# Patient Record
Sex: Male | Born: 1990 | Race: Black or African American | Hispanic: No | Marital: Single | Smoking: Current some day smoker
Health system: Southern US, Community
[De-identification: ages and names within clinical notes are randomized; demographics above are authoritative.]

## PROBLEM LIST (undated history)

## (undated) HISTORY — PX: NO PAST SURGERIES: SHX2092

---
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
## 2012-04-01 ENCOUNTER — Emergency Department: Payer: Self-pay | Admitting: Unknown Physician Specialty

## 2012-04-01 LAB — CBC
HCT: 42.4 % (ref 40.0–52.0)
HGB: 15.1 g/dL (ref 13.0–18.0)
MCH: 30.9 pg (ref 26.0–34.0)
MCV: 87 fL (ref 80–100)
RDW: 13.6 % (ref 11.5–14.5)
WBC: 8.4 10*3/uL (ref 3.8–10.6)

## 2012-04-01 LAB — COMPREHENSIVE METABOLIC PANEL
Albumin: 4.1 g/dL (ref 3.4–5.0)
Alkaline Phosphatase: 61 U/L (ref 50–136)
Anion Gap: 5 — ABNORMAL LOW (ref 7–16)
Bilirubin,Total: 0.6 mg/dL (ref 0.2–1.0)
Creatinine: 0.88 mg/dL (ref 0.60–1.30)
Glucose: 81 mg/dL (ref 65–99)
Osmolality: 287 (ref 275–301)
Potassium: 4 mmol/L (ref 3.5–5.1)
Sodium: 145 mmol/L (ref 136–145)
Total Protein: 7.3 g/dL (ref 6.4–8.2)

## 2012-04-01 LAB — URINALYSIS, COMPLETE
Bacteria: NONE SEEN
Blood: NEGATIVE
Glucose,UR: NEGATIVE mg/dL (ref 0–75)
Ketone: NEGATIVE
Leukocyte Esterase: NEGATIVE
Nitrite: NEGATIVE
Ph: 5 (ref 4.5–8.0)
Protein: NEGATIVE
Specific Gravity: 1.031 (ref 1.003–1.030)
WBC UR: 1 /HPF (ref 0–5)

## 2013-10-24 IMAGING — CT CT HEAD WITHOUT CONTRAST
2 series · 16 of 30 positions shown, 20 images · non-contrast
Comparison: none

REASON FOR EXAM: sudden onset headache wtih blurred vision     13
COMMENTS:   LMP: (Male)

PROCEDURE:     CT  - CT HEAD WITHOUT CONTRAST  - April 01, 2012  [DATE]
RESULT:     History: Blurred vision.
Comparison Study: No prior.

[Series 2: without · axial · non-contrast · 0.48mm/px · z∈[-177,-57]mm · 13 of 30 slices shown, 17 images]
[im 3/30  brain]
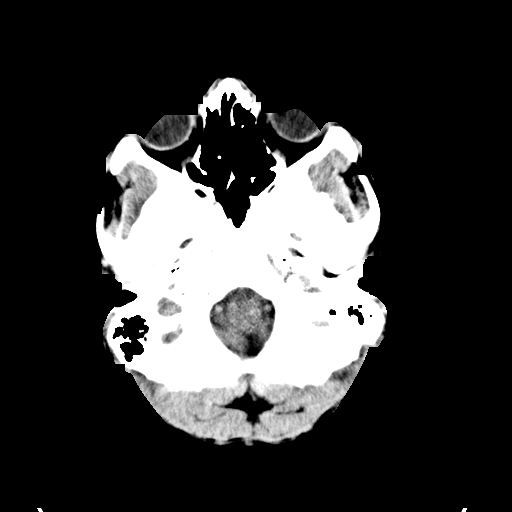
[im 3/30  bone]
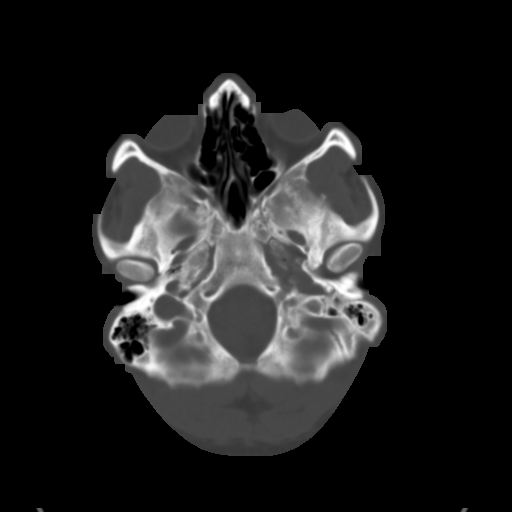
[im 5/30  brain]
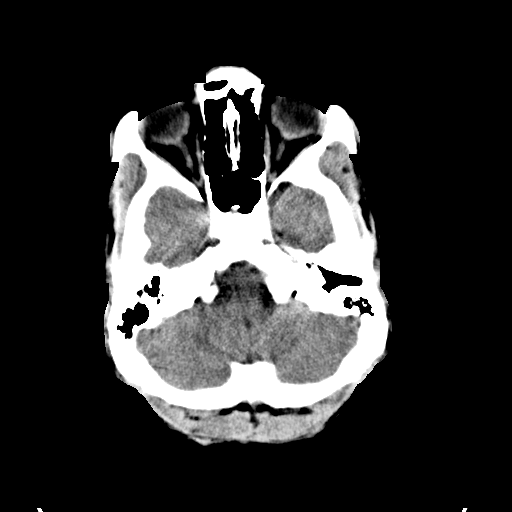
[im 7/30  brain]
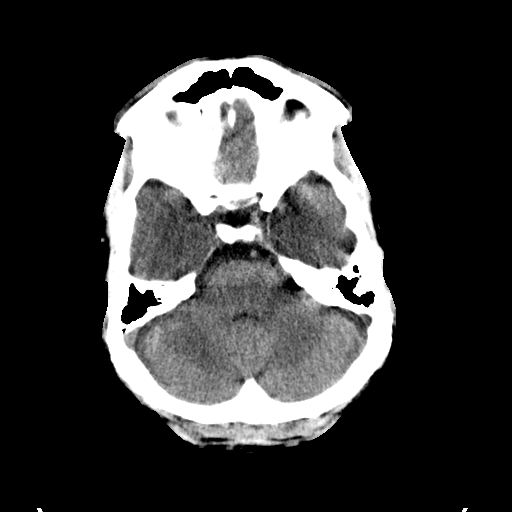
[im 9/30  brain]
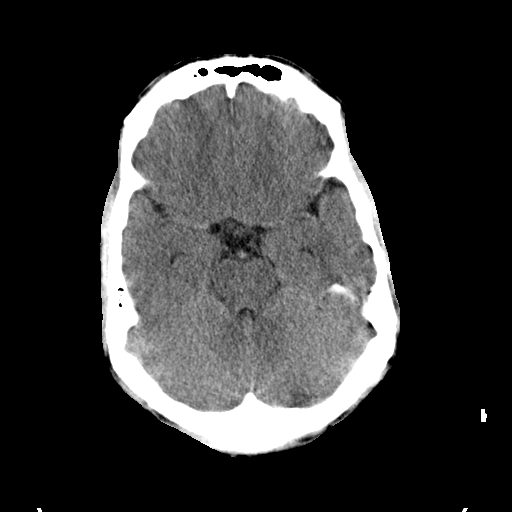
[im 11/30  brain]
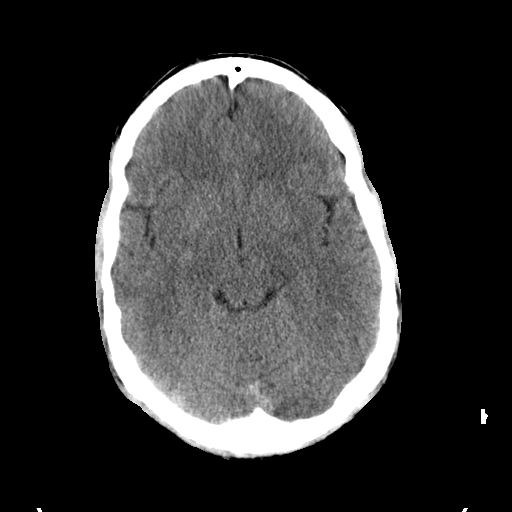
[im 11/30  bone]
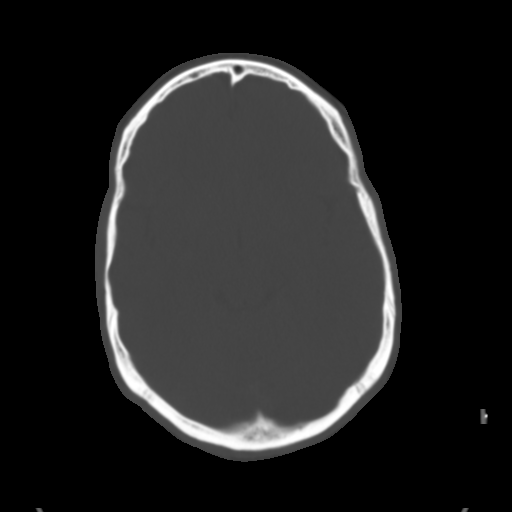
[im 13/30  brain]
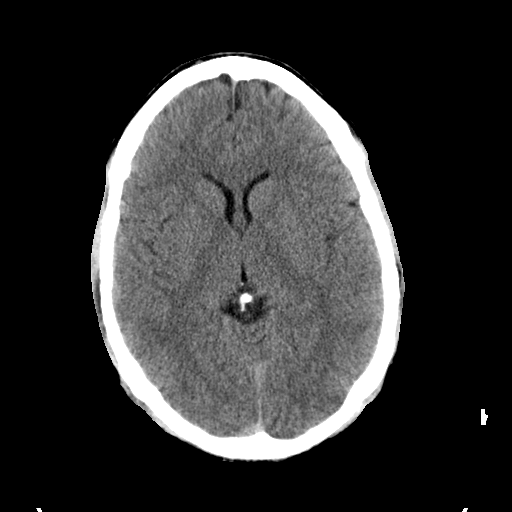
[im 15/30  brain]
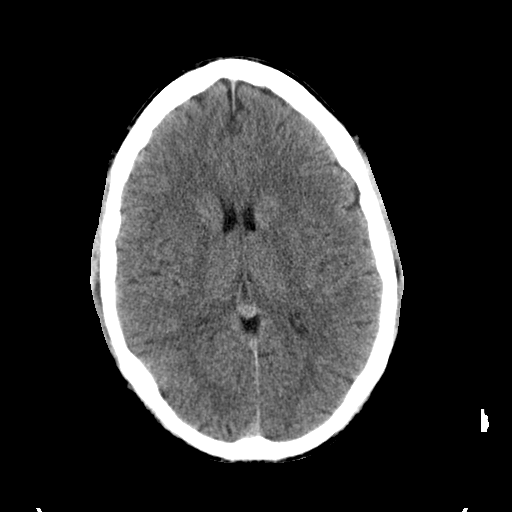
[im 17/30  brain]
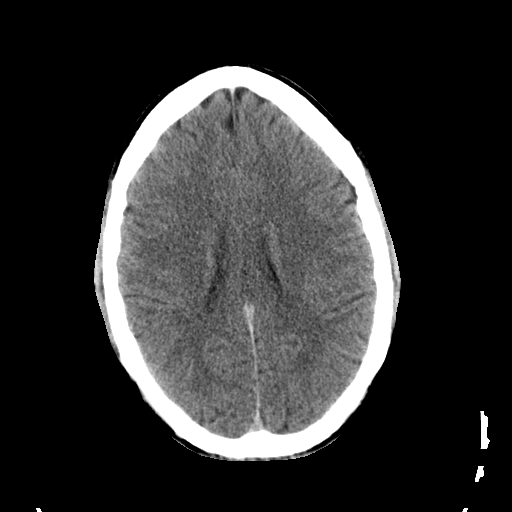
[im 19/30  brain]
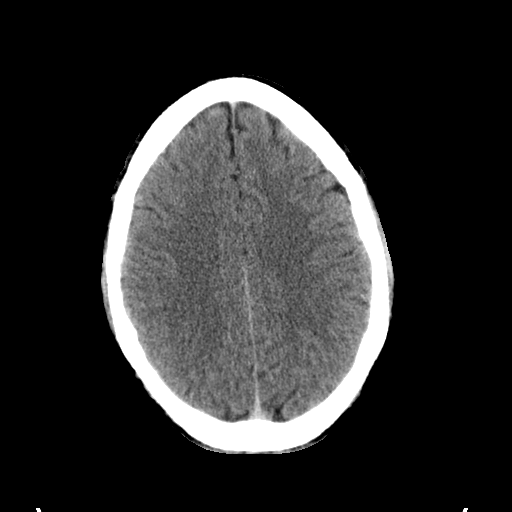
[im 19/30  bone]
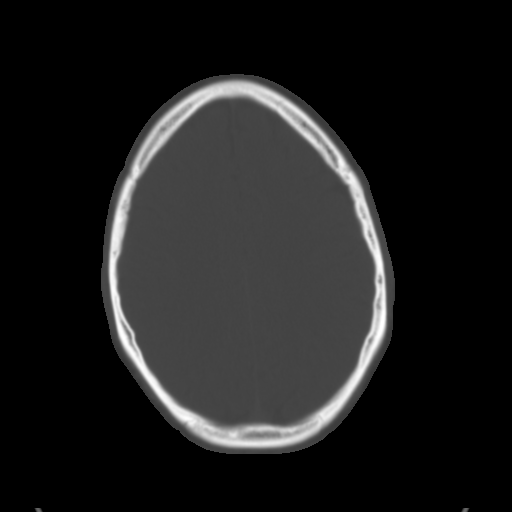
[im 21/30  brain]
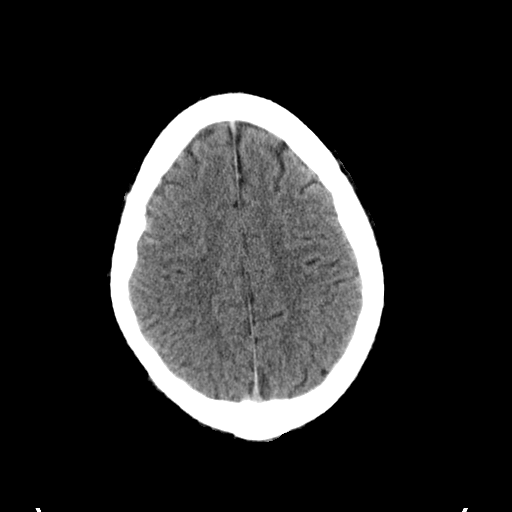
[im 23/30  brain]
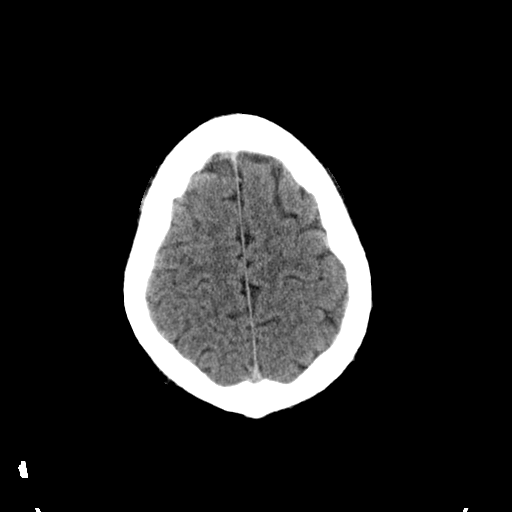
[im 25/30  brain]
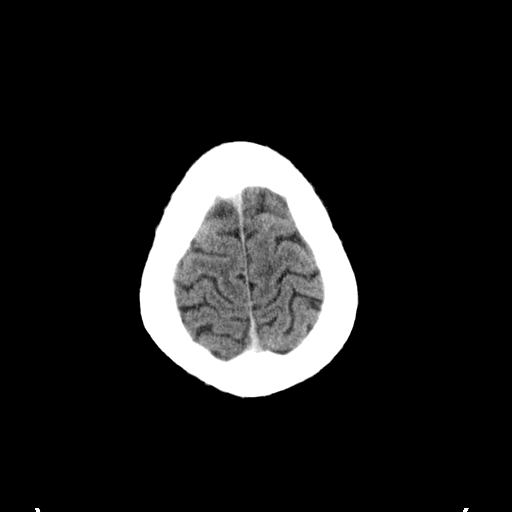
[im 27/30  brain]
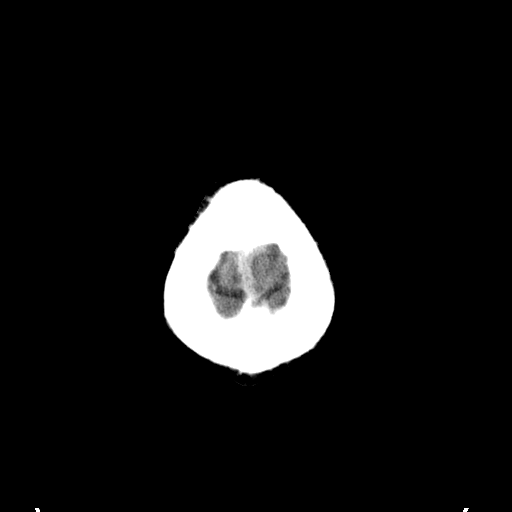
[im 27/30  bone]
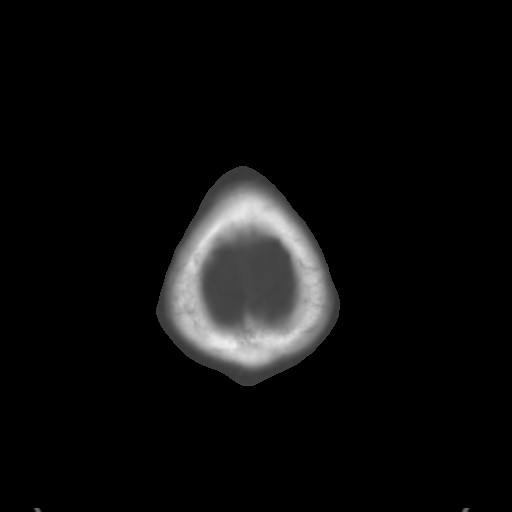

[Series 3: bone · axial · 0.48mm/px · z∈[-177,-137]mm · 3 of 30 slices shown]
[im 3/30  bone]
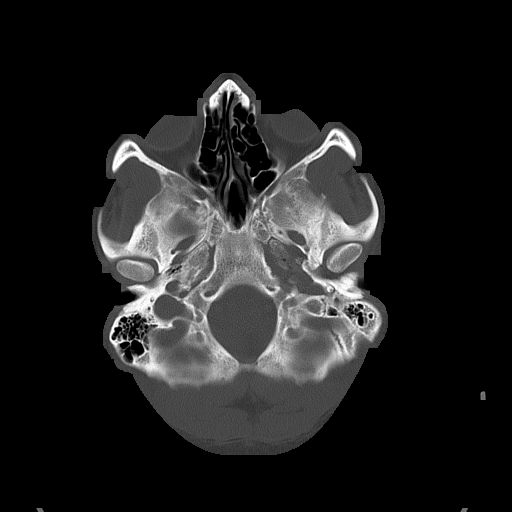
[im 7/30  bone]
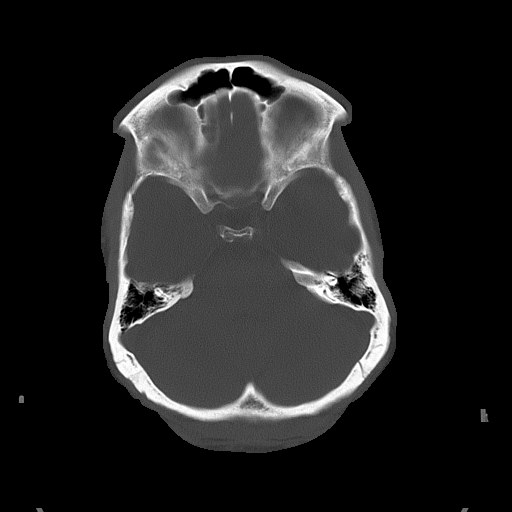
[im 11/30  bone]
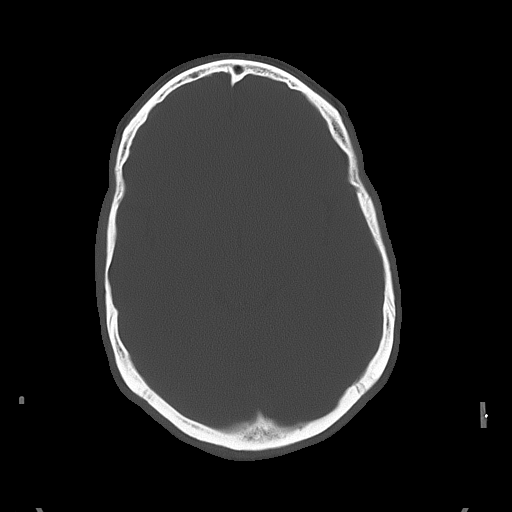

[16 of 30 positions shown; findings below may reference images not displayed]

FINDINGS: Standard nonenhanced CT obtained. No mass. No hydrocephalus. No
hemorrhage. No acute bony abnormality.
IMPRESSION: No acute abnormality.

## 2014-05-05 ENCOUNTER — Ambulatory Visit: Payer: Self-pay | Admitting: Emergency Medicine

## 2016-01-25 ENCOUNTER — Emergency Department
Admission: EM | Admit: 2016-01-25 | Discharge: 2016-01-25 | Disposition: A | Discharge status: Home / Self Care | Payer: Self-pay | Attending: Emergency Medicine | Admitting: Emergency Medicine

## 2016-01-25 DIAGNOSIS — Y990 Civilian activity done for income or pay: Secondary | ICD-10-CM | POA: Insufficient documentation

## 2016-01-25 DIAGNOSIS — M549 Dorsalgia, unspecified: Secondary | ICD-10-CM | POA: Insufficient documentation

## 2016-01-25 DIAGNOSIS — M62838 Other muscle spasm: Secondary | ICD-10-CM

## 2016-01-25 DIAGNOSIS — G44319 Acute post-traumatic headache, not intractable: Secondary | ICD-10-CM

## 2016-01-25 DIAGNOSIS — Y92410 Unspecified street and highway as the place of occurrence of the external cause: Secondary | ICD-10-CM | POA: Insufficient documentation

## 2016-01-25 DIAGNOSIS — Y9389 Activity, other specified: Secondary | ICD-10-CM | POA: Insufficient documentation

## 2016-01-25 DIAGNOSIS — M542 Cervicalgia: Secondary | ICD-10-CM | POA: Insufficient documentation

## 2016-01-25 MED ORDER — MELOXICAM 15 MG PO TABS: 15.0000 mg | ORAL_TABLET | Freq: Every day | ORAL | 0 refills | Status: DC

## 2016-01-25 MED ORDER — METHOCARBAMOL 500 MG PO TABS: 500.0000 mg | ORAL_TABLET | Freq: Four times a day (QID) | ORAL | 0 refills | Status: DC
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
## 2017-08-29 ENCOUNTER — Emergency Department
Admission: EM | Admit: 2017-08-29 | Discharge: 2017-08-29 | Disposition: A | Discharge status: Home / Self Care | Payer: Self-pay | Attending: Emergency Medicine | Admitting: Emergency Medicine

## 2017-08-29 ENCOUNTER — Encounter: Payer: Self-pay | Admitting: Emergency Medicine

## 2017-08-29 DIAGNOSIS — Z87891 Personal history of nicotine dependence: Secondary | ICD-10-CM | POA: Insufficient documentation

## 2017-08-29 DIAGNOSIS — J0110 Acute frontal sinusitis, unspecified: Secondary | ICD-10-CM | POA: Insufficient documentation

## 2017-08-29 DIAGNOSIS — K920 Hematemesis: Secondary | ICD-10-CM | POA: Insufficient documentation

## 2017-08-29 LAB — CBC WITH DIFFERENTIAL/PLATELET
BASOS ABS: 0.1 10*3/uL (ref 0–0.1)
Basophils Relative: 1 %
EOS PCT: 2 %
Eosinophils Absolute: 0.1 10*3/uL (ref 0–0.7)
HEMATOCRIT: 40.1 % (ref 40.0–52.0)
Hemoglobin: 13.8 g/dL (ref 13.0–18.0)
LYMPHS ABS: 2.5 10*3/uL (ref 1.0–3.6)
Lymphocytes Relative: 32 %
MCH: 29.8 pg (ref 26.0–34.0)
MCHC: 34.5 g/dL (ref 32.0–36.0)
MCV: 86.3 fL (ref 80.0–100.0)
MONO ABS: 0.6 10*3/uL (ref 0.2–1.0)
MONOS PCT: 8 %
NEUTROS ABS: 4.6 10*3/uL (ref 1.4–6.5)
Neutrophils Relative %: 57 %
PLATELETS: 179 10*3/uL (ref 150–440)
RBC: 4.64 MIL/uL (ref 4.40–5.90)
RDW: 14.3 % (ref 11.5–14.5)
WBC: 7.9 10*3/uL (ref 3.8–10.6)

## 2017-08-29 LAB — COMPREHENSIVE METABOLIC PANEL
ALT: 27 U/L (ref 17–63)
AST: 26 U/L (ref 15–41)
Albumin: 4.1 g/dL (ref 3.5–5.0)
Alkaline Phosphatase: 51 U/L (ref 38–126)
Anion gap: 9 (ref 5–15)
BILIRUBIN TOTAL: 0.5 mg/dL (ref 0.3–1.2)
BUN: 14 mg/dL (ref 6–20)
CHLORIDE: 108 mmol/L (ref 101–111)
CO2: 24 mmol/L (ref 22–32)
CREATININE: 0.8 mg/dL (ref 0.61–1.24)
Calcium: 8.6 mg/dL — ABNORMAL LOW (ref 8.9–10.3)
Glucose, Bld: 92 mg/dL (ref 65–99)
POTASSIUM: 3.6 mmol/L (ref 3.5–5.1)
Sodium: 141 mmol/L (ref 135–145)
TOTAL PROTEIN: 7.4 g/dL (ref 6.5–8.1)

## 2017-08-29 LAB — LIPASE, BLOOD: LIPASE: 20 U/L (ref 11–51)

## 2017-08-29 LAB — ETHANOL: ALCOHOL ETHYL (B): 22 mg/dL — AB (ref <10)

## 2017-08-29 MED ORDER — OXYMETAZOLINE HCL 0.05 % NA SOLN: 1.0000 | Freq: Two times a day (BID) | NASAL | 0 refills | Status: DC

## 2017-08-29 MED ORDER — ONDANSETRON 4 MG PO TBDP: 4.0000 mg | ORAL_TABLET | Freq: Three times a day (TID) | ORAL | 0 refills | Status: DC | PRN

## 2017-08-29 MED ORDER — FAMOTIDINE 20 MG PO TABS: 20.0000 mg | ORAL_TABLET | Freq: Two times a day (BID) | ORAL | 1 refills | Status: DC

## 2017-08-29 MED ORDER — ONDANSETRON HCL 4 MG/2ML IJ SOLN
4.0000 mg | Freq: Once | INTRAMUSCULAR | Status: AC
Start: 2017-08-29 — End: 2017-08-29
  Administered 2017-08-29: 4 mg via INTRAVENOUS
  Filled 2017-08-29: qty 2

## 2017-08-29 MED ORDER — FAMOTIDINE IN NACL 20-0.9 MG/50ML-% IV SOLN
20.0000 mg | Freq: Once | INTRAVENOUS | Status: AC
Start: 2017-08-29 — End: 2017-08-29
  Administered 2017-08-29: 20 mg via INTRAVENOUS
  Filled 2017-08-29: qty 50

## 2017-08-29 NOTE — ED Triage Notes (Signed)
Patient with complaint of headache times two days. Patient reports vomiting bright red blood times two this morning.

## 2017-08-29 NOTE — ED Provider Notes (Signed)
[facility] Emergency Department Provider Note       Time seen: ----------------------------------------- 7:00 AM on 08/29/2017 -----------------------------------------   I have reviewed the triage vital signs and the nursing notes.  HISTORY   Chief Complaint Hematemesis and Headache    HPI Kurt Ruiz is a 27 y.o. male with no significant past medical history who presents to the ED for complaint of headache which is mostly frontal for the past 2 days.  Patient states his sinuses have been bothering him for about 3 days.  Patient reports this morning he started having some vomiting he saw some bright red blood in his vomit twice this morning.  He also has had some diarrhea.  He denies any fever.  He denies significant alcohol intake or NSAID use.  History reviewed. No pertinent past medical history.  There are no active problems to display for this patient.   History reviewed. No pertinent surgical history.  Allergies Patient has no known allergies.  Social History Social History   Tobacco Use  . Smoking status: Former Games developer  . Smokeless tobacco: Never Used  Substance Use Topics  . Alcohol use: Yes  . Drug use: Never    Review of Systems Constitutional: Negative for fever. ENT: Positive for sinus congestion Cardiovascular: Negative for chest pain. Respiratory: Negative for shortness of breath. Gastrointestinal: Negative for abdominal pain, positive for hematemesis, diarrhea Genitourinary: Negative for dysuria. Musculoskeletal: Negative for back pain. Skin: Negative for rash. Neurological: Positive for headache  All systems negative/normal/unremarkable except as stated in the HPI  ____________________________________________   PHYSICAL EXAM:  VITAL SIGNS: ED Triage Vitals [08/29/17 0657]  Enc Vitals Group     BP 129/67     Pulse Rate 72     Resp 18     Temp 98.2 F (36.8 C)     Temp Source Oral     SpO2 94 %     Weight  229 lb (103.9 kg)     Height 5\' 7"  (1.702 m)     Head Circumference      Peak Flow      Pain Score 7     Pain Loc      Pain Edu?      Excl. in GC?    Constitutional: Alert and oriented. Well appearing and in no distress. Eyes: Conjunctivae are normal. Normal extraocular movements. ENT   Head: Normocephalic and atraumatic.   Nose: No congestion/rhinnorhea.   Mouth/Throat: Mucous membranes are moist.   Neck: No stridor. Cardiovascular: Normal rate, regular rhythm. No murmurs, rubs, or gallops. Respiratory: Normal respiratory effort without tachypnea nor retractions. Breath sounds are clear and equal bilaterally. No wheezes/rales/rhonchi. Gastrointestinal: Soft and nontender. Normal bowel sounds Musculoskeletal: Nontender with normal range of motion in extremities. No lower extremity tenderness nor edema. Neurologic:  Normal speech and language. No gross focal neurologic deficits are appreciated.  Skin:  Skin is warm, dry and intact. No rash noted. Psychiatric: Mood and affect are normal. Speech and behavior are normal.  ____________________________________________  ED COURSE:  As part of my medical decision making, I reviewed the following data within the electronic MEDICAL RECORD NUMBER History obtained from family if available, nursing notes, old chart and ekg, as well as notes from prior ED visits. Patient presented for headache and vomiting, we will assess with labs as indicated at this time.   Procedures ____________________________________________   LABS (pertinent positives/negatives)  Labs Reviewed  COMPREHENSIVE METABOLIC PANEL - Abnormal; Notable for the following components:  Result Value   Calcium 8.6 (*)    All other components within normal limits  ETHANOL - Abnormal; Notable for the following components:   Alcohol, Ethyl (B) 22 (*)    All other components within normal limits  CBC WITH DIFFERENTIAL/PLATELET  LIPASE, BLOOD   ___________________________________________  DIFFERENTIAL DIAGNOSIS   Gastritis, gastroenteritis, Mallory-Weiss tear, sinusitis  FINAL ASSESSMENT AND PLAN  Sinusitis, vomiting   Plan: The patient had presented for headache and vomiting with potentially some blood in his vomit. Patient's labs here are reassuring.  I will prescribe some Afrin nasal spray as he has some sinusitis at the moment.  He likely has gastritis and was given Zofran and Pepcid here.  He will be discharged with similar and is cleared for outpatient GI follow-up.   Ulice DashJohnathan E Williams, MD   Note: This note was generated in part or whole with voice recognition software. Voice recognition is usually quite accurate but there are transcription errors that can and very often do occur. I apologize for any typographical errors that were not detected and corrected.     Emily FilbertWilliams, Jonathan E, MD 08/29/17 [id number]

## 2018-02-11 ENCOUNTER — Ambulatory Visit
Admission: EM | Admit: 2018-02-11 | Discharge: 2018-02-11 | Disposition: A | Discharge status: Home / Self Care | Attending: Family Medicine | Admitting: Family Medicine

## 2018-02-11 ENCOUNTER — Encounter: Payer: Self-pay | Admitting: Emergency Medicine

## 2018-02-11 ENCOUNTER — Ambulatory Visit (INDEPENDENT_AMBULATORY_CARE_PROVIDER_SITE_OTHER)

## 2018-02-11 ENCOUNTER — Other Ambulatory Visit: Payer: Self-pay

## 2018-02-11 DIAGNOSIS — M25561 Pain in right knee: Secondary | ICD-10-CM

## 2018-02-11 DIAGNOSIS — S93401A Sprain of unspecified ligament of right ankle, initial encounter: Secondary | ICD-10-CM

## 2018-02-11 DIAGNOSIS — W1842XA Slipping, tripping and stumbling without falling due to stepping into hole or opening, initial encounter: Secondary | ICD-10-CM

## 2018-02-11 MED ORDER — DICLOFENAC SODIUM 75 MG PO TBEC: 75.0000 mg | DELAYED_RELEASE_TABLET | Freq: Two times a day (BID) | ORAL | 0 refills | Status: DC | PRN

## 2018-02-11 NOTE — ED Triage Notes (Signed)
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

## 2018-02-11 NOTE — ED Provider Notes (Signed)
[facility]    CSN: [id number] Arrival date & time: 02/11/18  1432  History   Chief Complaint Chief Complaint  Patient presents with  . Ankle Injury    W/C DOI 02/11/18   HPI  27 year old male presents with right ankle pain.  Patient is a Paramedic.  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  He states that he stepped in a "hole" and twisted his right ankle.  He reports lateral ankle pain.  No swelling reported.  No swelling noted.  Patient also reports that he is having some right knee pain as well.  No medications or interventions tried.  Patient states that he wants to be out of work and wants to be on light duty.  No other reported symptoms.  No other complaints.  History reviewed and updated as below. PMH: Hx of R achilles tendon tear  Home Medications    Prior to Admission medications   Medication Sig Start Date End Date Taking? Authorizing Provider  Multiple Vitamin (ONE-A-DAY MENS PO) Take 1 tablet by mouth daily.   Yes [provider]  diclofenac (VOLTAREN) 75 MG EC tablet Take 1 tablet (75 mg total) by mouth 2 (two) times daily as needed. 02/11/18   Tommie Sams, DO    Family History Family History  Problem Relation Age of Onset  . Other Mother        unsure of medical history  . Hypertension Father     Social History Social History   Tobacco Use  . Smoking status: Former Smoker    Last attempt to quit: 08/11/2017    Years since quitting: 0.5  . Smokeless tobacco: Never Used  Substance Use Topics  . Alcohol use: Yes    Comment: occassionally  . Drug use: Never     Allergies   Patient has no known allergies.   Review of Systems Review of Systems  Musculoskeletal:       Right ankle and right knee pain.  Skin: Negative.    Physical Exam Triage Vital Signs ED Triage Vitals  Enc Vitals Group     BP 02/11/18 1507 (!) 122/91     Pulse Rate 02/11/18 1507 78     Resp 02/11/18 1507 16     Temp 02/11/18 1507 98.6 F  (37 C)     Temp Source 02/11/18 1507 Oral     SpO2 02/11/18 1507 99 %     Weight 02/11/18 1506 230 lb (104.3 kg)     Height 02/11/18 1506 5\' 7"  (1.702 m)     Head Circumference --      Peak Flow --      Pain Score 02/11/18 1506 7     Pain Loc --      Pain Edu? --      Excl. in GC? --    Updated Vital Signs BP (!) 122/91 (BP Location: Left Arm)   Pulse 78   Temp 98.6 F (37 C) (Oral)   Resp 16   Ht 5\' 7"  (1.702 m)   Wt 104.3 kg   SpO2 99%   BMI 36.02 kg/m   Visual Acuity Right Eye Distance:   Left Eye Distance:   Bilateral Distance:    Right Eye Near:   Left Eye Near:    Bilateral Near:     Physical Exam  Constitutional: He appears well-developed. No distress.  HENT:  Head: Normocephalic and atraumatic.  Cardiovascular: Normal rate and regular rhythm.  Pulmonary/Chest: Effort normal. He has no wheezes. He has no rales.  Musculoskeletal:  Right knee -no effusion.  No discrete areas of tenderness.  Ligaments intact.  Right ankle -no apparent swelling.  No erythema.  Patient with reported tenderness laterally.  Neurological: He is alert.  Skin: Skin is warm. No rash noted. No erythema.  Psychiatric: He has a normal mood and affect. His behavior is normal.  Nursing note and vitals reviewed.  UC Treatments / Results  Labs (all labs ordered are listed, but only abnormal results are displayed) Labs Reviewed - No data to display  EKG None  Radiology Dg Ankle Complete Right  Result Date: 02/11/2018 CLINICAL DATA:  Twisting injury.  Pain. EXAM: RIGHT ANKLE - COMPLETE 3+ VIEW COMPARISON:  None. FINDINGS: There is no evidence of fracture, dislocation, or joint effusion. There is no evidence of arthropathy or other focal bone abnormality. Soft tissues are unremarkable. IMPRESSION: Negative. Electronically Signed   By: Paulina FusiMark  Shogry M.D.   On: 02/11/2018 15:40    Procedures Procedures (including critical care time)  Medications Ordered in UC Medications - No data to  display  Initial Impression / Assessment and Plan / UC Course  I have reviewed the triage vital signs and the nursing notes.  Pertinent labs & imaging results that were available during my care of the patient were reviewed by me and considered in my medical decision making (see chart for details).    26-year male presents with mild ankle sprain.  Patient is ambulatory.  Exam is benign.  X-ray negative.  Advised rest, ice, elevation.  Diclofenac as directed.  Work note given.  Final Clinical Impressions(s) / UC Diagnoses   Final diagnoses:  Sprain of right ankle, unspecified ligament, initial encounter   Discharge Instructions   None    ED Prescriptions    Medication Sig Dispense Auth. Provider   diclofenac (VOLTAREN) 75 MG EC tablet Take 1 tablet (75 mg total) by mouth 2 (two) times daily as needed. 30 tablet Tommie Samsook, Flor Houdeshell G, DO     Controlled Substance Prescriptions [place] Controlled Substance Registry consulted? Not Applicable   Tommie SamsCook, Garnet Overfield G, DO 02/11/18 1601

## 2018-07-12 ENCOUNTER — Other Ambulatory Visit: Payer: Self-pay

## 2018-07-12 ENCOUNTER — Ambulatory Visit
Admission: EM | Admit: 2018-07-12 | Discharge: 2018-07-12 | Disposition: A | Discharge status: Home / Self Care | Payer: Self-pay | Attending: Family Medicine | Admitting: Family Medicine

## 2018-07-12 ENCOUNTER — Encounter: Payer: Self-pay | Admitting: Emergency Medicine

## 2018-07-12 DIAGNOSIS — R05 Cough: Secondary | ICD-10-CM

## 2018-07-12 DIAGNOSIS — R0981 Nasal congestion: Secondary | ICD-10-CM

## 2018-07-12 DIAGNOSIS — J029 Acute pharyngitis, unspecified: Secondary | ICD-10-CM

## 2018-07-12 DIAGNOSIS — J069 Acute upper respiratory infection, unspecified: Secondary | ICD-10-CM

## 2018-07-12 DIAGNOSIS — R51 Headache: Secondary | ICD-10-CM

## 2018-07-12 DIAGNOSIS — Z87891 Personal history of nicotine dependence: Secondary | ICD-10-CM

## 2018-07-12 DIAGNOSIS — B9789 Other viral agents as the cause of diseases classified elsewhere: Secondary | ICD-10-CM

## 2018-07-12 LAB — RAPID STREP SCREEN (MED CTR MEBANE ONLY): Streptococcus, Group A Screen (Direct): NEGATIVE

## 2018-07-12 MED ORDER — IPRATROPIUM BROMIDE 0.06 % NA SOLN: 2.0000 | Freq: Four times a day (QID) | NASAL | 0 refills | Status: DC | PRN

## 2018-07-12 MED ORDER — BENZONATATE 100 MG PO CAPS: 100.0000 mg | ORAL_CAPSULE | Freq: Three times a day (TID) | ORAL | 0 refills | Status: DC | PRN

## 2018-07-12 MED ORDER — MELOXICAM 15 MG PO TABS: 15.0000 mg | ORAL_TABLET | Freq: Every day | ORAL | 0 refills | Status: DC | PRN

## 2018-07-12 NOTE — Discharge Instructions (Signed)
Medications as directed.  Take care  Dr. Shanaya Schneck  

## 2018-07-12 NOTE — ED Triage Notes (Signed)
Pt c/o cough, sore throat, congestion, headache, body aches, and nausea. Started about a week ago.

## 2018-07-12 NOTE — ED Provider Notes (Signed)
[facility]    CSN: [id number] Arrival date & time: 07/12/18  0932  History   Chief Complaint Chief Complaint  Patient presents with  . Cough   HPI   28 year old male presents with respiratory symptoms.  Patient reports that he has been sick for 1 week.  He reports headache, sore throat, congestion, dizziness, cough.  No fever.  He endorses body aches.  Patient is concerned that he has flu.  His most troublesome symptom currently is bitemporal headache.  No known exacerbating relieving factors.  No other associated symptoms.  No other complaints.  PMH, Surgical Hx, Family Hx, Social History reviewed and updated as below. PMH: Achilles tendon rupture, Sinusitis  Past Surgical History:  Procedure Laterality Date  . NO PAST SURGERIES     Home Medications    Prior to Admission medications   Medication Sig Start Date End Date Taking? Authorizing Provider  Multiple Vitamin (ONE-A-DAY MENS PO) Take 1 tablet by mouth daily.   Yes [provider]  benzonatate (TESSALON) 100 MG capsule Take 1 capsule (100 mg total) by mouth 3 (three) times daily as needed. 07/12/18   Everlene Other G, DO  ipratropium (ATROVENT) 0.06 % nasal spray Place 2 sprays into both nostrils 4 (four) times daily as needed for rhinitis. 07/12/18   Tommie Sams, DO  meloxicam (MOBIC) 15 MG tablet Take 1 tablet (15 mg total) by mouth daily as needed for pain. 07/12/18   Tommie Sams, DO    Family History Family History  Problem Relation Age of Onset  . Other Mother        unsure of medical history  . Hypertension Father     Social History Social History   Tobacco Use  . Smoking status: Former Smoker    Last attempt to quit: 08/11/2017    Years since quitting: 0.9  . Smokeless tobacco: Never Used  Substance Use Topics  . Alcohol use: Yes    Comment: occassionally  . Drug use: Not Currently     Allergies   Patient has no known allergies.   Review of Systems Review of Systems Per  HPI  Physical Exam Triage Vital Signs ED Triage Vitals  Enc Vitals Group     BP 07/12/18 1001 121/86     Pulse Rate 07/12/18 1001 72     Resp 07/12/18 1001 18     Temp 07/12/18 1001 98.3 F (36.8 C)     Temp Source 07/12/18 1001 Oral     SpO2 07/12/18 1001 98 %     Weight 07/12/18 0957 235 lb (106.6 kg)     Height 07/12/18 0957 5\' 7"  (1.702 m)     Head Circumference --      Peak Flow --      Pain Score 07/12/18 0957 7     Pain Loc --      Pain Edu? --      Excl. in GC? --    Updated Vital Signs BP 121/86 (BP Location: Left Arm)   Pulse 72   Temp 98.3 F (36.8 C) (Oral)   Resp 18   Ht 5\' 7"  (1.702 m)   Wt 106.6 kg   SpO2 98%   BMI 36.81 kg/m   Visual Acuity Right Eye Distance:   Left Eye Distance:   Bilateral Distance:    Right Eye Near:   Left Eye Near:    Bilateral Near:     Physical Exam Vitals signs and nursing note reviewed.  Constitutional:      General: He is not in acute distress.    Appearance: Normal appearance.  HENT:     Head: Normocephalic and atraumatic.     Right Ear: Tympanic membrane normal.     Left Ear: Tympanic membrane normal.     Mouth/Throat:     Pharynx: Oropharynx is clear. No posterior oropharyngeal erythema.  Eyes:     General:        Right eye: No discharge.        Left eye: No discharge.     Conjunctiva/sclera: Conjunctivae normal.  Cardiovascular:     Rate and Rhythm: Normal rate and regular rhythm.  Pulmonary:     Effort: Pulmonary effort is normal.     Breath sounds: Normal breath sounds. No wheezing, rhonchi or rales.  Neurological:     Mental Status: He is alert.  Psychiatric:        Mood and Affect: Mood normal.        Behavior: Behavior normal.    UC Treatments / Results  Labs (all labs ordered are listed, but only abnormal results are displayed) Labs Reviewed  RAPID STREP SCREEN ([facility] ONLY)  CULTURE, GROUP A STREP [facility])    EKG None  Radiology No results found.  Procedures Procedures  (including critical care time)  Medications Ordered in UC Medications - No data to display  Initial Impression / Assessment and Plan / UC Course  I have reviewed the triage vital signs and the nursing notes.  Pertinent labs & imaging results that were available during my care of the patient were reviewed by me and considered in my medical decision making (see chart for details).    28 year old male presents with viral respiratory illness.  Treating with Atrovent, Tessalon Perles, ibuprofen.  Final Clinical Impressions(s) / UC Diagnoses   Final diagnoses:  Viral URI with cough     Discharge Instructions     Medications as directed.  Take care  Dr. Adriana Simasook    ED Prescriptions    Medication Sig Dispense Auth. Provider   ipratropium (ATROVENT) 0.06 % nasal spray Place 2 sprays into both nostrils 4 (four) times daily as needed for rhinitis. 15 mL Melvin Whiteford G, DO   benzonatate (TESSALON) 100 MG capsule Take 1 capsule (100 mg total) by mouth 3 (three) times daily as needed. 30 capsule Madelina Sanda G, DO   meloxicam (MOBIC) 15 MG tablet Take 1 tablet (15 mg total) by mouth daily as needed for pain. 30 tablet Tommie Samsook, Bobie Caris G, DO     Controlled Substance Prescriptions [place] Controlled Substance Registry consulted? Not Applicable   Tommie SamsCook, Jetty Berland G, DO 07/12/18 1108

## 2018-07-15 ENCOUNTER — Telehealth (HOSPITAL_COMMUNITY): Payer: Self-pay | Admitting: Emergency Medicine

## 2018-07-15 LAB — CULTURE, GROUP A STREP (THRC)

## 2018-07-15 NOTE — Telephone Encounter (Signed)
Culture is positive for non group A Strep germ.  This is a finding of uncertain significance; not the typical 'strep throat' germ.  Attempted to reach patient. No answer at this time.  

## 2019-01-04 ENCOUNTER — Encounter: Payer: Self-pay | Admitting: Emergency Medicine

## 2019-01-04 ENCOUNTER — Ambulatory Visit
Admission: EM | Admit: 2019-01-04 | Discharge: 2019-01-04 | Disposition: A | Discharge status: Home / Self Care | Payer: Self-pay | Attending: Family Medicine | Admitting: Family Medicine

## 2019-01-04 ENCOUNTER — Other Ambulatory Visit: Payer: Self-pay

## 2019-01-04 DIAGNOSIS — Z20822 Contact with and (suspected) exposure to covid-19: Secondary | ICD-10-CM

## 2019-01-04 DIAGNOSIS — R6889 Other general symptoms and signs: Secondary | ICD-10-CM

## 2019-01-04 DIAGNOSIS — R0789 Other chest pain: Secondary | ICD-10-CM

## 2019-01-04 DIAGNOSIS — R51 Headache: Secondary | ICD-10-CM

## 2019-01-04 DIAGNOSIS — R05 Cough: Secondary | ICD-10-CM

## 2019-01-04 MED ORDER — MELOXICAM 15 MG PO TABS: 15.0000 mg | ORAL_TABLET | Freq: Every day | ORAL | 0 refills | Status: AC | PRN

## 2019-01-04 MED ORDER — BENZONATATE 200 MG PO CAPS: 200.0000 mg | ORAL_CAPSULE | Freq: Three times a day (TID) | ORAL | 0 refills | Status: AC | PRN

## 2019-01-04 NOTE — Discharge Instructions (Signed)
Rest.  Medication as needed.  Stay home.  If you worsen, go to the ER.  Take care  Dr. Lacinda Axon

## 2019-01-04 NOTE — ED Triage Notes (Signed)
Pt c/o cough, shortness of breath, headache, and chest pain. Started about 4 days ago. He has had chills and body aches.

## 2019-01-04 NOTE — ED Provider Notes (Signed)
[facility]    CSN: [id number] Arrival date & time: 01/04/19  1347  History   Chief Complaint Chief Complaint  Patient presents with  . Cough  . Chest Pain  . Headache   HPI  28 year old male presents with multiple complaints.  Patient reports that he has not been feeling well since Saturday.  Reports that he had to leave work on Saturday.  He reports cough, shortness of breath, sharp chest pain, headache.  He also endorses some chills and body aches.  No documented fever.  Pain is currently 6/10 in severity.  His primary complaint is headache.  No known inciting factor.  No known exacerbating or relieving factors.  No other reported symptoms.  No other complaints.  PMH, Surgical Hx, Family Hx, Social History reviewed and updated as below.  PMH: Hx of achilles tendon rupture  Past Surgical History:  Procedure Laterality Date  . NO PAST SURGERIES     Home Medications    Prior to Admission medications   Medication Sig Start Date End Date Taking? Authorizing Provider  benzonatate (TESSALON) 200 MG capsule Take 1 capsule (200 mg total) by mouth 3 (three) times daily as needed for cough. 01/04/19   Coral Spikes, DO  meloxicam (MOBIC) 15 MG tablet Take 1 tablet (15 mg total) by mouth daily as needed for pain. 01/04/19   Thersa Salt G, DO  ipratropium (ATROVENT) 0.06 % nasal spray Place 2 sprays into both nostrils 4 (four) times daily as needed for rhinitis. 07/12/18 01/04/19  Coral Spikes, DO    Family History Family History  Problem Relation Age of Onset  . Other Mother        unsure of medical history  . Hypertension Father     Social History Social History   Tobacco Use  . Smoking status: Current Every Day Smoker  . Smokeless tobacco: Never Used  Substance Use Topics  . Alcohol use: Yes    Comment: occassionally  . Drug use: Not Currently     Allergies   Patient has no known allergies.   Review of Systems Review of Systems  Constitutional: Positive  for chills.  Respiratory: Positive for cough.   Cardiovascular: Positive for chest pain.  Musculoskeletal:       Bodyaches.  Neurological: Positive for headaches.   Physical Exam Triage Vital Signs ED Triage Vitals  Enc Vitals Group     BP 01/04/19 1404 128/76     Pulse Rate 01/04/19 1404 75     Resp 01/04/19 1404 18     Temp 01/04/19 1404 98.4 F (36.9 C)     Temp Source 01/04/19 1404 Oral     SpO2 01/04/19 1404 97 %     Weight 01/04/19 1401 230 lb (104.3 kg)     Height 01/04/19 1401 5\' 7"  (1.702 m)     Head Circumference --      Peak Flow --      Pain Score 01/04/19 1400 6     Pain Loc --      Pain Edu? --      Excl. in [place]? --    Updated Vital Signs BP 128/76 (BP Location: Right Arm)   Pulse 75   Temp 98.4 F (36.9 C) (Oral)   Resp 18   Ht 5\' 7"  (1.702 m)   Wt 104.3 kg   SpO2 97%   BMI 36.02 kg/m   Visual Acuity Right Eye Distance:   Left Eye Distance:   Bilateral  Distance:    Right Eye Near:   Left Eye Near:    Bilateral Near:     Physical Exam Vitals signs and nursing note reviewed.  Constitutional:      General: He is not in acute distress.    Appearance: Normal appearance.  HENT:     Head: Normocephalic and atraumatic.  Eyes:     General:        Right eye: No discharge.        Left eye: No discharge.     Conjunctiva/sclera: Conjunctivae normal.  Cardiovascular:     Rate and Rhythm: Normal rate and regular rhythm.  Pulmonary:     Effort: Pulmonary effort is normal.     Breath sounds: Normal breath sounds. No wheezing, rhonchi or rales.  Neurological:     Mental Status: He is alert.  Psychiatric:        Mood and Affect: Mood normal.        Behavior: Behavior normal.    UC Treatments / Results  Labs (all labs ordered are listed, but only abnormal results are displayed) Labs Reviewed  NOVEL CORONAVIRUS, NAA (HOSPITAL ORDER, SEND-OUT TO REF LAB)    EKG   Radiology No results found.  Procedures Procedures (including critical care  time)  Medications Ordered in UC Medications - No data to display  Initial Impression / Assessment and Plan / UC Course  I have reviewed the triage vital signs and the nursing notes.  Pertinent labs & imaging results that were available during my care of the patient were reviewed by me and considered in my medical decision making (see chart for details).    28 year old male presents with multiple complaints. Possible COVID-19.  Vital signs stable.  Patient is well-appearing.  Exam unremarkable.  Awaiting COVID testing.  Meloxicam and Tessalon Perles as directed.  Supportive care.  Final Clinical Impressions(s) / UC Diagnoses   Final diagnoses:  Suspected Covid-19 Virus Infection     Discharge Instructions     Rest.  Medication as needed.  Stay home.  If you worsen, go to the ER.  Take care  Dr. Adriana Simasook    ED Prescriptions    Medication Sig Dispense Auth. Provider   benzonatate (TESSALON) 200 MG capsule Take 1 capsule (200 mg total) by mouth 3 (three) times daily as needed for cough. 20 capsule Jamel Dunton G, DO   meloxicam (MOBIC) 15 MG tablet Take 1 tablet (15 mg total) by mouth daily as needed for pain. 30 tablet Tommie Samsook, Apurva Reily G, DO     Controlled Substance Prescriptions [place] Controlled Substance Registry consulted? Not Applicable   Tommie SamsCook, Vitor Overbaugh G, DO 01/04/19 1446

## 2019-01-05 LAB — NOVEL CORONAVIRUS, NAA (HOSP ORDER, SEND-OUT TO REF LAB; TAT 18-24 HRS): SARS-CoV-2, NAA: NOT DETECTED

## 2020-04-23 ENCOUNTER — Emergency Department
Admission: EM | Admit: 2020-04-23 | Discharge: 2020-04-23 | Disposition: A | Discharge status: Home / Self Care | Payer: Federal, State, Local not specified - PPO | Attending: Emergency Medicine | Admitting: Emergency Medicine

## 2020-04-23 ENCOUNTER — Emergency Department: Payer: Federal, State, Local not specified - PPO

## 2020-04-23 ENCOUNTER — Other Ambulatory Visit: Payer: Self-pay

## 2020-04-23 ENCOUNTER — Encounter: Payer: Self-pay | Admitting: Emergency Medicine

## 2020-04-23 DIAGNOSIS — Y92410 Unspecified street and highway as the place of occurrence of the external cause: Secondary | ICD-10-CM | POA: Diagnosis not present

## 2020-04-23 DIAGNOSIS — F17210 Nicotine dependence, cigarettes, uncomplicated: Secondary | ICD-10-CM | POA: Insufficient documentation

## 2020-04-23 DIAGNOSIS — S161XXA Strain of muscle, fascia and tendon at neck level, initial encounter: Secondary | ICD-10-CM | POA: Diagnosis not present

## 2020-04-23 DIAGNOSIS — S199XXA Unspecified injury of neck, initial encounter: Secondary | ICD-10-CM | POA: Diagnosis present

## 2020-04-23 MED ORDER — ORPHENADRINE CITRATE ER 100 MG PO TB12: 100.0000 mg | ORAL_TABLET | Freq: Two times a day (BID) | ORAL | 0 refills | Status: AC

## 2020-04-23 MED ORDER — NAPROXEN 500 MG PO TABS: 500.0000 mg | ORAL_TABLET | Freq: Two times a day (BID) | ORAL | Status: AC

## 2020-04-23 NOTE — Discharge Instructions (Signed)
Follow discharge care instruction.  You have a muscle relaxer which may cause drowsiness with driving.  Advised not to take muscle relaxant when you are driving or operating machinery.
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
## 2020-04-27 ENCOUNTER — Emergency Department
Admission: EM | Admit: 2020-04-27 | Discharge: 2020-04-27 | Disposition: A | Discharge status: Home / Self Care | Payer: Federal, State, Local not specified - PPO | Attending: Emergency Medicine | Admitting: Emergency Medicine

## 2020-04-27 ENCOUNTER — Other Ambulatory Visit: Payer: Self-pay

## 2020-04-27 ENCOUNTER — Encounter: Payer: Self-pay | Admitting: Emergency Medicine

## 2020-04-27 DIAGNOSIS — M549 Dorsalgia, unspecified: Secondary | ICD-10-CM | POA: Diagnosis not present

## 2020-04-27 DIAGNOSIS — F17210 Nicotine dependence, cigarettes, uncomplicated: Secondary | ICD-10-CM | POA: Insufficient documentation

## 2020-04-27 DIAGNOSIS — S161XXD Strain of muscle, fascia and tendon at neck level, subsequent encounter: Secondary | ICD-10-CM

## 2020-04-27 DIAGNOSIS — Y92410 Unspecified street and highway as the place of occurrence of the external cause: Secondary | ICD-10-CM | POA: Diagnosis not present

## 2020-04-27 DIAGNOSIS — S161XXS Strain of muscle, fascia and tendon at neck level, sequela: Secondary | ICD-10-CM | POA: Diagnosis not present

## 2020-04-27 DIAGNOSIS — Y9389 Activity, other specified: Secondary | ICD-10-CM | POA: Diagnosis not present

## 2020-04-27 DIAGNOSIS — S199XXA Unspecified injury of neck, initial encounter: Secondary | ICD-10-CM | POA: Diagnosis present

## 2020-04-27 NOTE — ED Notes (Signed)
NAD noted at time of D/C. Pt denies questions or concerns. Pt ambulatory to the lobby at this time.  

## 2020-04-27 NOTE — Discharge Instructions (Signed)
Follow up with orthopedics as listed above or a chiropractor, Dr Thermon Leyland is a chiropractor in [place], try calling her
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
## 2020-04-27 NOTE — ED Triage Notes (Signed)
Pt to ED from home c/o upper back and neck pain.  States in [facility] on the 22nd of this month and was seen afterwards.  Pt A&Ox4, ambulatory to triage, in NAD at this time.

## 2021-07-02 ENCOUNTER — Ambulatory Visit: Payer: Self-pay

## 2021-09-12 ENCOUNTER — Encounter: Payer: Self-pay | Admitting: Family Medicine

## 2021-09-12 ENCOUNTER — Ambulatory Visit: Payer: Self-pay | Admitting: Family Medicine

## 2021-09-12 DIAGNOSIS — Z113 Encounter for screening for infections with a predominantly sexual mode of transmission: Secondary | ICD-10-CM

## 2021-09-12 LAB — GRAM STAIN

## 2021-09-12 LAB — HM HIV SCREENING LAB: HM HIV Screening: NEGATIVE

## 2021-09-12 NOTE — Progress Notes (Signed)
Pt here for STD screening.  Gram stain results reviewed, no treatment required per SO.  Condoms given.  Mihailo Sage M Sheranda Seabrooks, RN  

## 2021-09-12 NOTE — Progress Notes (Signed)
[facility] Department ?STI clinic/screening visit ? ?Subjective:  ?Kurt Ruiz is a 31 y.o. male being seen today for an STI screening visit. The patient reports they do not have symptoms.   ? ?Patient has the following medical conditions:  There are no problems to display for this patient. ? ? ? ?Chief Complaint  ?Patient presents with  ? SEXUALLY TRANSMITTED DISEASE  ?  STI screening, No sx  ? ? ?HPI ? ?Patient reports here for screening, denies s/sx  ? ?Does the patient or their partner desires a pregnancy in the next year? No ? ?Screening for MPX risk: ?Does the patient have an unexplained rash? No ?Is the patient MSM? No ?Does the patient endorse multiple sex partners or anonymous sex partners? No ?Did the patient have close or sexual contact with a person diagnosed with MPX? No ?Has the patient traveled outside the [place] where MPX is endemic? No ?Is there a high clinical suspicion for MPX-- evidenced by one of the following No ? -Unlikely to be chickenpox ? -Lymphadenopathy ? -Rash that present in same phase of evolution on any given body part ? ? ?See flowsheet for further details and programmatic requirements.  ? ? ?The following portions of the patient's history were reviewed and updated as appropriate: allergies, current medications, past medical history, past social history, past surgical history and problem list. ? ?Objective:  ?There were no vitals filed for this visit. ? ?Physical Exam ?Constitutional:   ?   Appearance: Normal appearance.  ?HENT:  ?   Head: Normocephalic.  ?   Mouth/Throat:  ?   Mouth: Mucous membranes are moist.  ?   Pharynx: Oropharynx is clear. No oropharyngeal exudate.  ?Pulmonary:  ?   Effort: Pulmonary effort is normal.  ?Genitourinary: ?   Penis: Normal.   ?   Testes: Normal.  ?   Comments: No lice, nits, or pest, no lesions or odor discharge.  Denies pain or tenderness with paplation of testicles.  No lesions, ulcers or masses present.    ?Musculoskeletal:  ?    Cervical back: Normal range of motion.  ?Lymphadenopathy:  ?   Cervical: No cervical adenopathy.  ?Skin: ?   General: Skin is warm and dry.  ?   Findings: No bruising, erythema, lesion or rash.  ?Neurological:  ?   General: No focal deficit present.  ?   Mental Status: He is alert and oriented to person, place, and time.  ?Psychiatric:     ?   Mood and Affect: Mood normal.     ?   Behavior: Behavior normal.  ? ? ? ? ?Assessment and Plan:  ?Kurt Ruiz is a 31 y.o. male presenting to the [facility] Department for STI screening ? ?1. Screening examination for venereal disease ?Patient does not have STI symptoms ?Patient accepted all screenings including  gram stain,  oral, urethral GC and bloodwork for HIV/RPR.  ?Patient meets criteria for HepB screening? Yes. Ordered? No - declined  ?Patient meets criteria for HepC screening? Yes. Ordered? No - declined  ?Recommended condom use with all sex ?Discussed importance of condom use for STI prevent ? ?Treat gram stain per standing order ?Discussed time line for [facility] results and that patient will be called with positive results and encouraged patient to call if he had not heard in 2 weeks ?Recommended returning for continued or worsening symptoms.   ?- Gonococcus culture ?- Gram stain ?- HIV [place] [facility] ?- Syphilis Serology,  [facility] ?- Gonococcus culture ? ? ? ? ?No follow-ups on file. ? ?No future appointments. ? ?Wendi Snipes, FNP ?

## 2021-09-16 LAB — GONOCOCCUS CULTURE
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
## 2022-09-08 ENCOUNTER — Encounter: Payer: Self-pay | Admitting: Emergency Medicine

## 2022-09-08 ENCOUNTER — Emergency Department
Admission: EM | Admit: 2022-09-08 | Discharge: 2022-09-08 | Disposition: A | Discharge status: Home / Self Care | Payer: Federal, State, Local not specified - PPO | Attending: Emergency Medicine | Admitting: Emergency Medicine

## 2022-09-08 DIAGNOSIS — R519 Headache, unspecified: Secondary | ICD-10-CM | POA: Diagnosis present

## 2022-09-08 NOTE — Discharge Instructions (Signed)
Please go to the following website to schedule new (and existing) patient appointments:   [email or web address]   The following is a list of primary care offices in the area who are accepting new patients at this time.  Please reach out to one of them directly and let them know you would like to schedule an appointment to follow up on an Emergency Department visit, and/or to establish a new primary care provider (PCP).  There are likely other primary care clinics in the are who are accepting new patients, but this is an excellent place to start:  [facility] Lead physician: Dr Angela Bacigalupo [address] [place], [place] [address] [phone]  [facility] Lead Physician: Dr Krichna Sowles [address], [place], [place] [address] [phone]  [facility]  Lead Physician: Dr Megan Johnson [address], [place], [place] [address] [phone]  [facility] Lead Physician: Dr Alex Karamalegos [address], [place], [place] [address] [phone]  [facility] [place] Lead Physician: Dr Laura Berglund [address], [place], [place] [address] [phone]   

## 2022-09-08 NOTE — ED Triage Notes (Signed)
Pt c/o intermittent HA x3 days. Pt also inquiring about FMLA paperwork due to his job requesting because pt has missed days off.

## 2022-09-08 NOTE — ED Provider Notes (Signed)
[facility] Provider Note    Event Date/Time   First MD Initiated Contact with Patient 09/08/22 [phone]     (approximate)   History   Headache   HPI  Kurt Ruiz is a 32 y.o. male no significant past medical history presents with headaches.  Patient tells me that over the last several months he has had issues with migraine headaches.  Gets about 2-3 headaches per month.  They last for 2 to 3 days and then go away.  Describes a squeezing type feeling in the bilateral temporal parietal region.  No associated vision change numbness tingling weakness.  No vomiting.  Does feel worse with bright light.  Seems to be brought on by working and doing activities improved with rest.  Patient is here specifically tonight requesting FMLA paperwork because he has had to call out of work multiple times for his headaches.  He also has had increasing anxiety over having to call out of work.  Does not feel like he is sleeping well.  Patient tells me he just has mild headache currently.  Was worse yesterday but is resolving.  Typically takes Tylenol only for it.     History reviewed. No pertinent past medical history.  There are no problems to display for this patient.    Physical Exam  Triage Vital Signs: ED Triage Vitals  Enc Vitals Group     BP 09/08/22 0504 (!) 153/89     Pulse Rate 09/08/22 0504 (!) 101     Resp 09/08/22 0504 16     Temp 09/08/22 0504 98 F (36.7 C)     Temp Source 09/08/22 0504 Oral     SpO2 09/08/22 0504 100 %     Weight 09/08/22 0505 229 lb 15 oz (104.3 kg)     Height 09/08/22 0505 5\' 8"  (1.727 m)     Head Circumference --      Peak Flow --      Pain Score --      Pain Loc --      Pain Edu? --      Excl. in GC? --     Most recent vital signs: Vitals:   09/08/22 0504  BP: (!) 153/89  Pulse: (!) 101  Resp: 16  Temp: 98 F (36.7 C)  SpO2: 100%     General: Awake, no distress.  CV:  Good peripheral perfusion.  Resp:  Normal  effort.  Abd:  No distention.  Neuro:             Awake, Alert, Oriented x 3  Other:  Aox3, nml speech  PERRL, EOMI, face symmetric, nml tongue movement  5/5 strength in the BL upper and lower extremities  Sensation grossly intact in the BL upper and lower extremities  Finger-nose-finger intact BL    ED Results / Procedures / Treatments  Labs (all labs ordered are listed, but only abnormal results are displayed) Labs Reviewed - No data to display   EKG     RADIOLOGY    PROCEDURES:  Critical Care performed: No  Procedures   MEDICATIONS ORDERED IN ED: Medications - No data to display   IMPRESSION / MDM / ASSESSMENT AND PLAN / ED COURSE  I reviewed the triage vital signs and the nursing notes.                              Patient's presentation is most  consistent with acute, uncomplicated illness.  Differential diagnosis includes, but is not limited to, tension headache, migraine headache, low suspicion for intracranial mass  Is a 32 year old male who presents specifically requesting FMLA paperwork because he has had issues with headaches recently making him have to call out from work.  Tells me over the last 2 to 3 months he has had about 2-3 headaches per month that last for several days and make it so he is unable to work.  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  Headaches seem to be brought on by activity and improved when he is resting.  Also has increasing anxiety seem to contribute his headaches.  Is not sleeping well.  Patient has only a minor headache currently.  Denies any neurologic symptoms otherwise including vision change numbness weakness.  Not currently have a primary care doctor.  Patient's mildly hypertensive on arrival, mild tachycardia.  On exam he looks well neurologic exam is nonfocal.  I suspect patient's headache or tension headache.  Likely exacerbated by stressors and not sleeping.  I explained to patient that we do not typically do FMLA paperwork from the  ED.  I referral for PCP follow-up and gave him a list of PCP providers.       FINAL CLINICAL IMPRESSION(S) / ED DIAGNOSES   Final diagnoses:  Nonintractable headache, unspecified chronicity pattern, unspecified headache type     Rx / DC Orders   ED Discharge Orders          Ordered    Ambulatory Referral to Primary Care (Establish Care)        09/08/22 0527             Note:  This document was prepared using Dragon voice recognition software and may include unintentional dictation errors.   Georga Hacking, MD 09/08/22 [phone]

## 2022-12-24 ENCOUNTER — Ambulatory Visit (INDEPENDENT_AMBULATORY_CARE_PROVIDER_SITE_OTHER): Payer: Self-pay | Admitting: Family Medicine

## 2022-12-24 DIAGNOSIS — Z91199 Patient's noncompliance with other medical treatment and regimen due to unspecified reason: Secondary | ICD-10-CM

## 2022-12-24 NOTE — Progress Notes (Signed)
Patient was not seen for appt d/t no call, no show, or late arrival >10 mins past appt time.   Elise T Payne, FNP  [facility] [address] [place], [place] [address] [phone] (phone) [phone] (fax) [facility]
